# Patient Record
Sex: Female | Born: 2000 | Race: Black or African American | Hispanic: No | Marital: Single | Smoking: Never smoker
Health system: Southern US, Community
[De-identification: ages and names within clinical notes are randomized; demographics above are authoritative.]

## PROBLEM LIST (undated history)

## (undated) DIAGNOSIS — J45909 Unspecified asthma, uncomplicated: Secondary | ICD-10-CM

---
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
## 2018-10-05 IMAGING — CR DG NECK SOFT TISSUE
2 series · 2 of 2 positions shown · non-contrast
Comparison: None.

CLINICAL DATA: Laryngitis.  Difficulty breathing.

EXAM:
NECK SOFT TISSUES - 1+ VIEW

[w soft tissue neck ap]
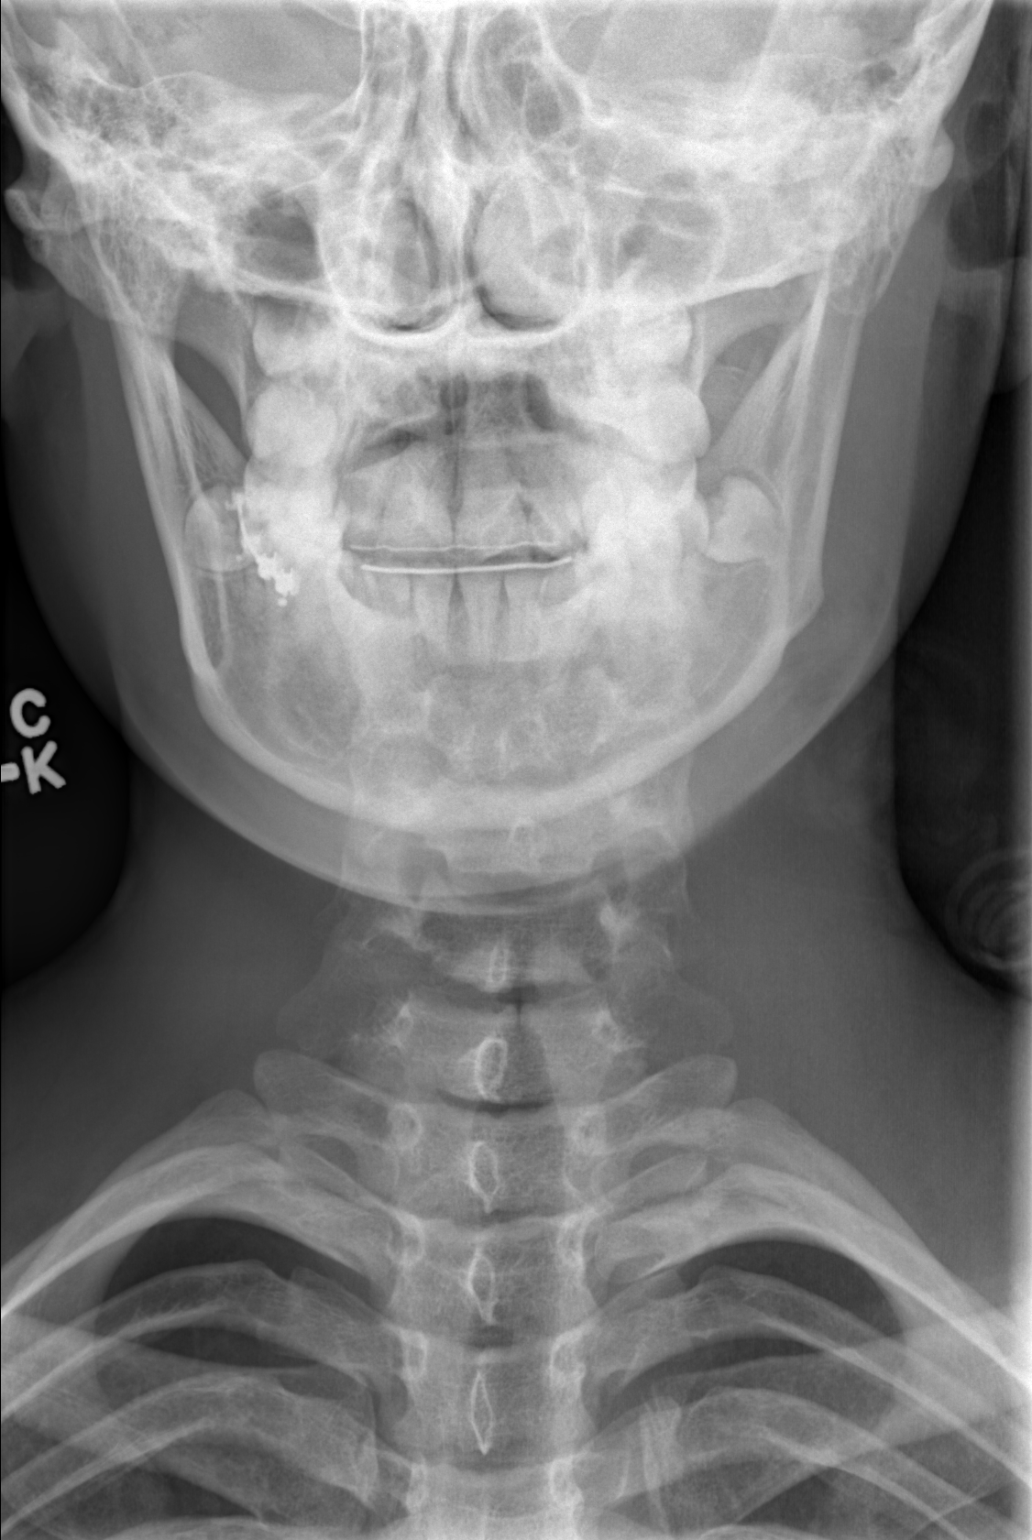

[w soft tissue neck]
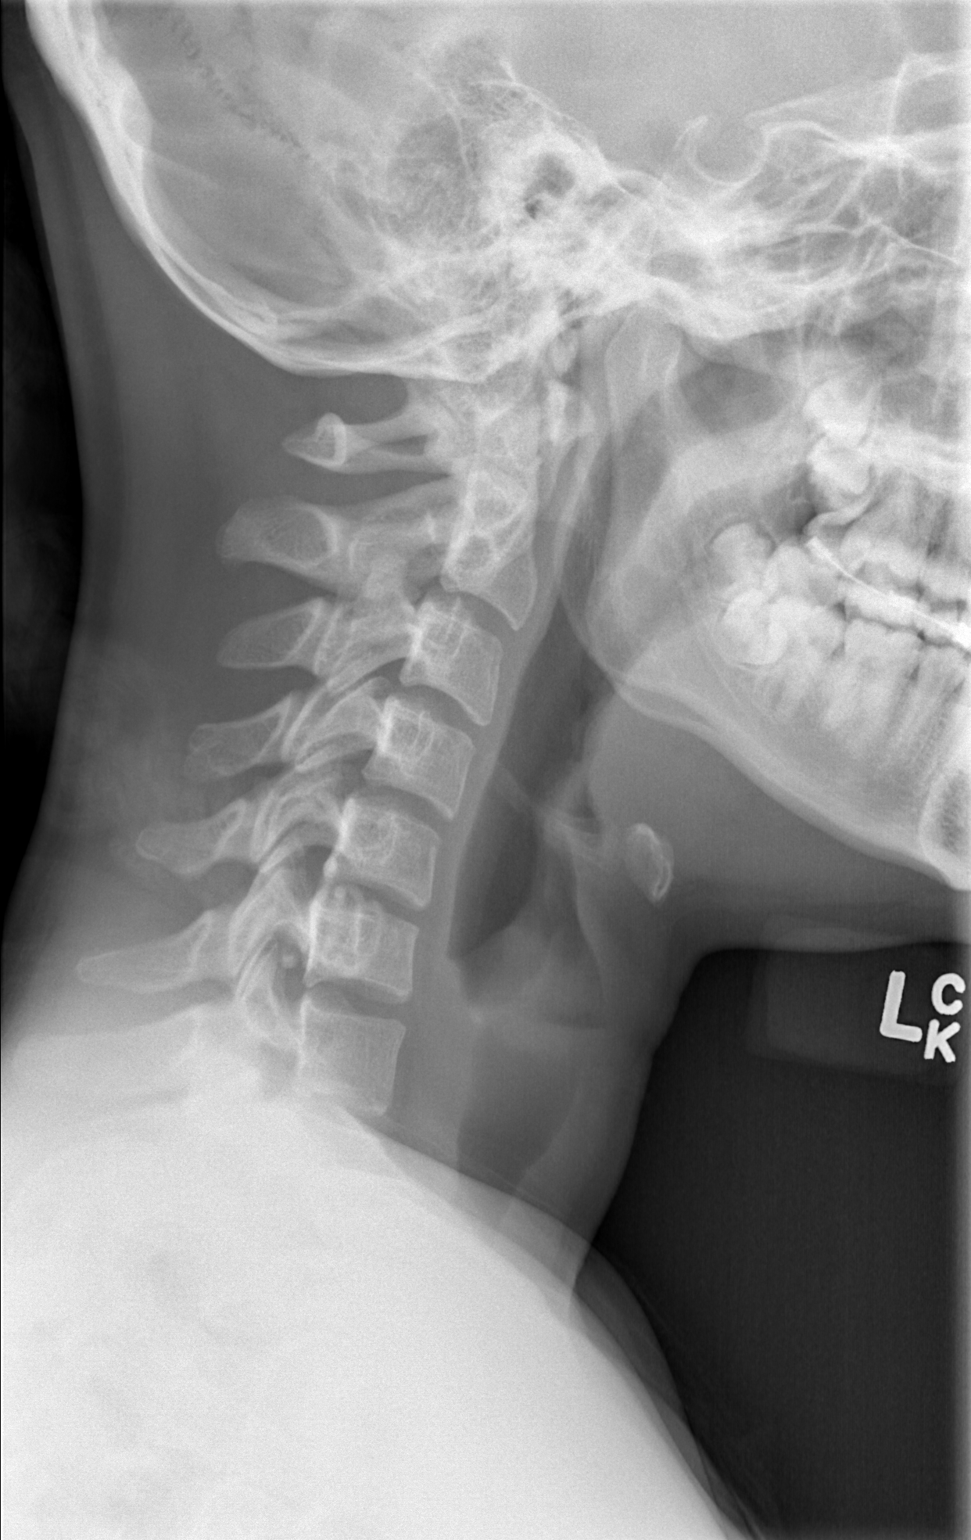

[2 of 2 positions shown; findings below may reference images not displayed]

FINDINGS: Two views study shows no prevertebral soft tissue swelling.
Epiglottis and area epiglottic folds are normal in appearance.
Visualized bony anatomy is unremarkable.
IMPRESSION: Negative.
